# Patient Record
Sex: Male | Born: 1990 | Race: White | Hispanic: No | Marital: Single | Smoking: Current every day smoker
Health system: Southern US, Community
[De-identification: ages and names within clinical notes are randomized; demographics above are authoritative.]

## PROBLEM LIST (undated history)

## (undated) HISTORY — PX: NO PAST SURGERIES: SHX2092

---

## 2017-11-25 ENCOUNTER — Other Ambulatory Visit: Payer: Self-pay

## 2017-11-25 ENCOUNTER — Ambulatory Visit (INDEPENDENT_AMBULATORY_CARE_PROVIDER_SITE_OTHER): Payer: 59

## 2017-11-25 ENCOUNTER — Ambulatory Visit
Admission: EM | Admit: 2017-11-25 | Discharge: 2017-11-25 | Disposition: A | Discharge status: Home / Self Care | Payer: 59 | Attending: Internal Medicine | Admitting: Internal Medicine

## 2017-11-25 DIAGNOSIS — M25474 Effusion, right foot: Secondary | ICD-10-CM

## 2017-11-25 DIAGNOSIS — S9002XA Contusion of left ankle, initial encounter: Secondary | ICD-10-CM | POA: Diagnosis not present

## 2017-11-25 DIAGNOSIS — S96919A Strain of unspecified muscle and tendon at ankle and foot level, unspecified foot, initial encounter: Secondary | ICD-10-CM

## 2017-11-25 DIAGNOSIS — X58XXXA Exposure to other specified factors, initial encounter: Secondary | ICD-10-CM | POA: Diagnosis not present

## 2017-11-25 DIAGNOSIS — S93402A Sprain of unspecified ligament of left ankle, initial encounter: Secondary | ICD-10-CM

## 2017-11-25 DIAGNOSIS — M25471 Effusion, right ankle: Secondary | ICD-10-CM

## 2017-11-25 NOTE — Discharge Instructions (Addendum)
Ice area of pain for 15-20 minutes at a time 2-4 times a day for 48h. After that alternate ice and heat. Do stretches after heat.  Take Ibuprofen 400- 800 mg every 8h, and if you need more pain relief, you may add Tylenol between doses.    Clean the scrape with soap and water and apply triple antibiotic ointment twice a day for 5 days to prevent infection.
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
